# Patient Record
Sex: Male | Born: 1960 | Race: White | Hispanic: No | Marital: Married | Smoking: Never smoker
Health system: Southern US, Community
[De-identification: ages and names within clinical notes are randomized; demographics above are authoritative.]

---

## 2017-06-28 ENCOUNTER — Other Ambulatory Visit: Payer: Self-pay | Admitting: Internal Medicine

## 2017-06-28 ENCOUNTER — Ambulatory Visit
Admission: RE | Admit: 2017-06-28 | Discharge: 2017-06-28 | Disposition: A | Discharge status: Home / Self Care | Payer: PRIVATE HEALTH INSURANCE | Source: Ambulatory Visit | Attending: Internal Medicine | Admitting: Internal Medicine

## 2017-06-28 DIAGNOSIS — M546 Pain in thoracic spine: Secondary | ICD-10-CM | POA: Insufficient documentation

## 2018-12-31 IMAGING — CR DG THORACIC SPINE 2V
3 series · 3 of 3 positions shown · non-contrast
Comparison: None.

CLINICAL DATA: Pain at T7, T8, and T9 since trauma 9 days ago.

EXAM:
THORACIC SPINE 2 VIEWS

[t-spine ap]
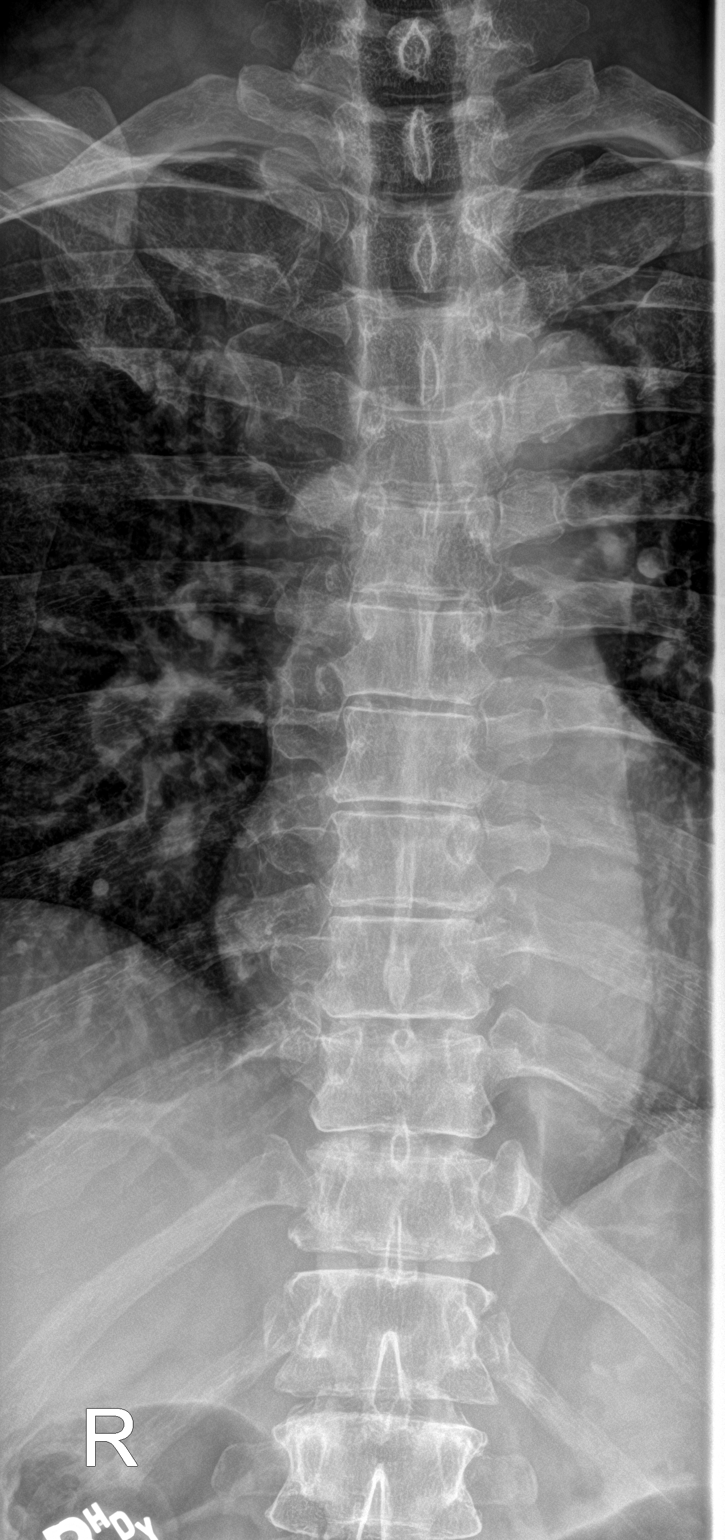

[t-spine lat]
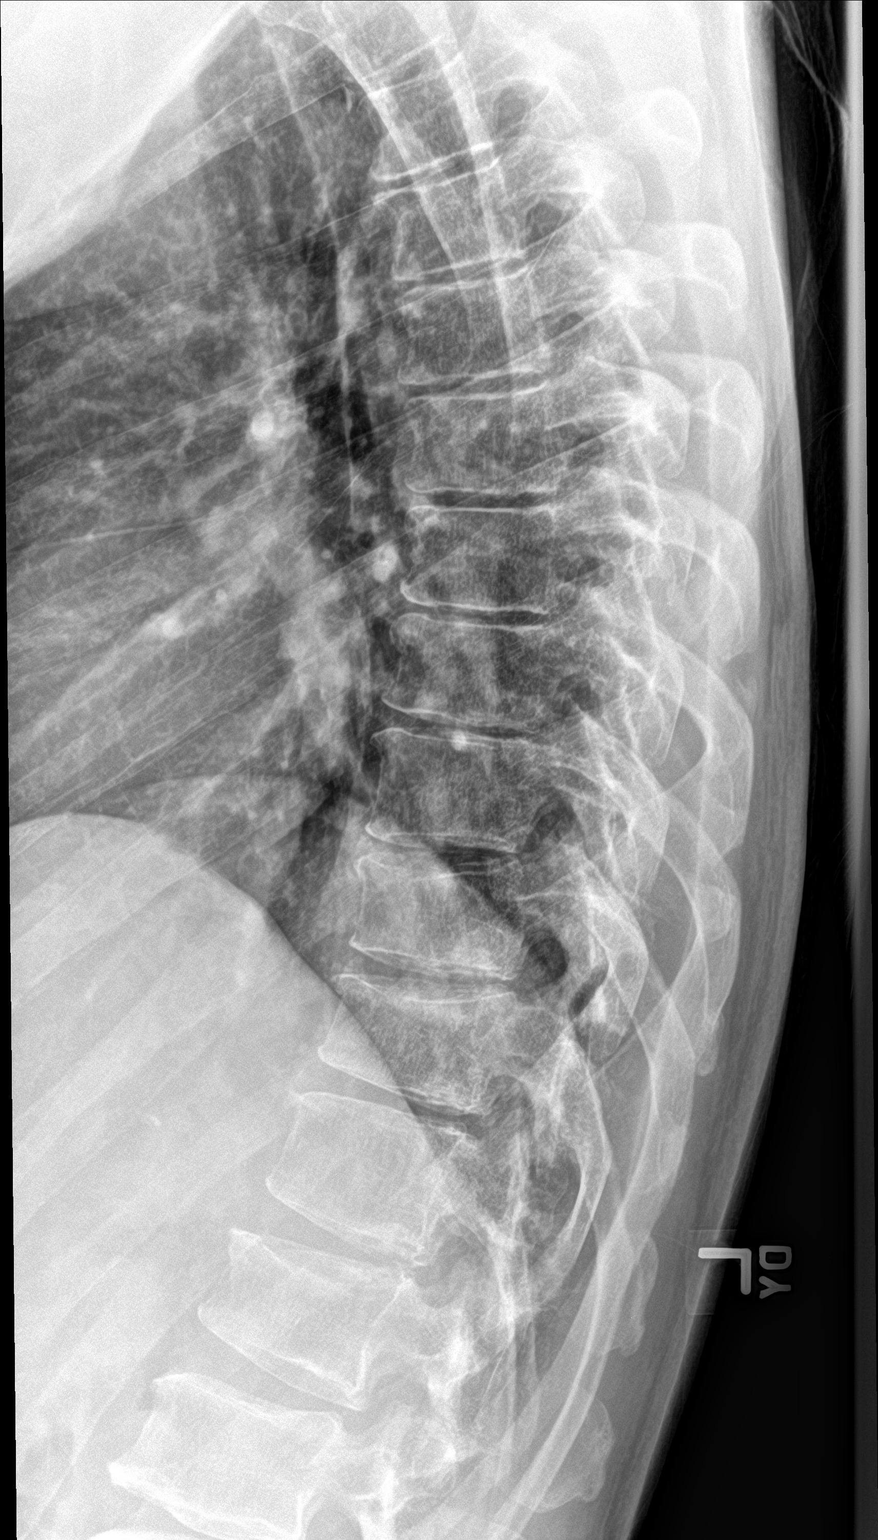

[t-spine swimmers]
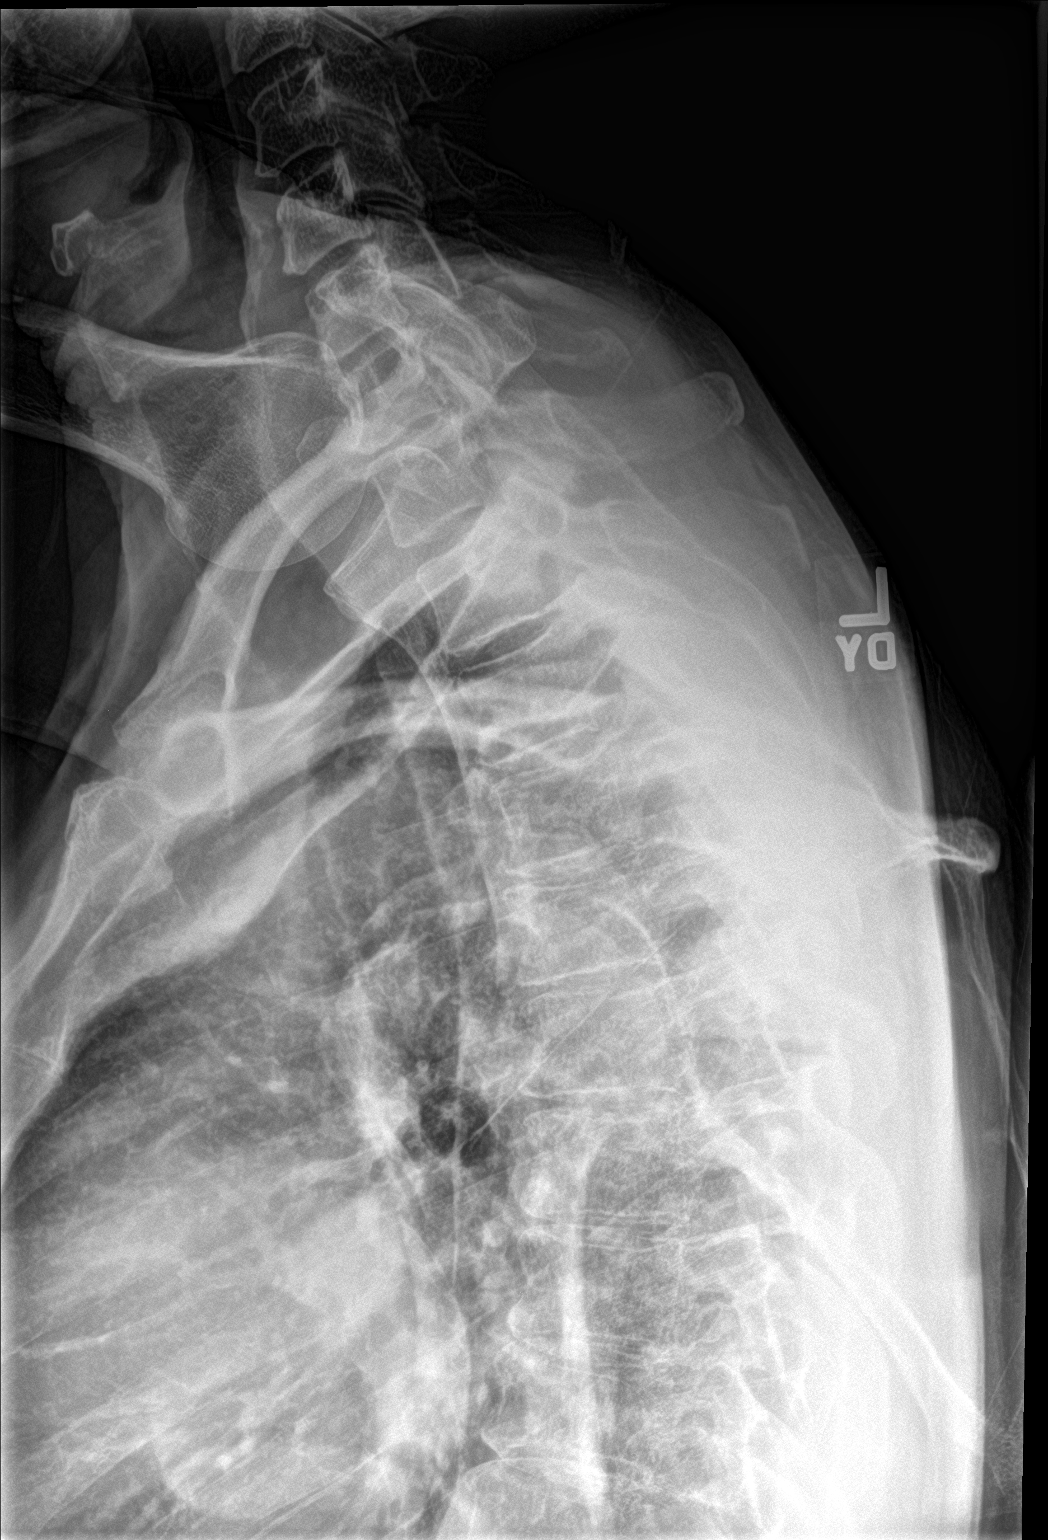

[3 of 3 positions shown; findings below may reference images not displayed]

FINDINGS: Limited views of the lungs are normal. No inter pedicular widening
identified. Anterior wedging is identified in 2 lower thoracic
vertebral bodies and an upper lumbar vertebral body, age
indeterminate. No other acute abnormalities identified.
IMPRESSION: 1. Anterior wedging of 3 vertebral bodies. I suspect 2 lower
thoracic vertebral bodies and an upper lumbar vertebral body are
involved. The findings are age indeterminate given history. A CT
scan or MRI could better assess chronicity as clinically warranted.

These results will be called to the ordering clinician or
representative by the Radiologist Assistant, and communication
documented in the PACS or zVision Dashboard.

## 2021-01-22 ENCOUNTER — Encounter: Payer: Self-pay | Admitting: Cardiovascular Disease

## 2021-01-22 ENCOUNTER — Telehealth (INDEPENDENT_AMBULATORY_CARE_PROVIDER_SITE_OTHER): Payer: PRIVATE HEALTH INSURANCE | Admitting: Cardiovascular Disease

## 2021-01-22 VITALS — BP 124/75 | HR 88 | Ht 73.75 in | Wt 181.0 lb

## 2021-01-22 DIAGNOSIS — Z13220 Encounter for screening for lipoid disorders: Secondary | ICD-10-CM

## 2021-01-22 DIAGNOSIS — Z136 Encounter for screening for cardiovascular disorders: Secondary | ICD-10-CM

## 2021-01-22 DIAGNOSIS — E785 Hyperlipidemia, unspecified: Secondary | ICD-10-CM

## 2021-01-22 NOTE — Progress Notes (Unsigned)
Virtual Visit via Video Note   This visit type was conducted due to national recommendations for restrictions regarding the COVID-19 Pandemic (e.g. social distancing) in an effort to limit this patient's exposure and mitigate transmission in our community.  Due to his co-morbid illnesses, this patient is at least at moderate risk for complications without adequate follow up.  This format is felt to be most appropriate for this patient at this time.  All issues noted in this document were discussed and addressed.  A limited physical exam was performed with this format.  Please refer to the patient's chart for his consent to telehealth for [facility].       Date:  01/22/2021   ID:  Robert Gutierrez, DOB 1961-05-21, MRN [id number] The patient was identified using 2 identifiers.  Patient Location: Other:  [place] Provider Location: Office/Clinic   PCP:  Marguarite Arbour, MD   [facility]   Cardiologist:  No primary care provider on file.  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  However, he is concerned about his cardiovascular risk and the possibility that he might be denied based on elevated cholesterol.  He is a very healthy person with no significant chronic medical conditions other than hyperlipidemia.  He has no history of essential hypertension, diabetes or tobacco use.  There is no family history of premature coronary artery disease.  His grandmother had CABG but she was in her 21s.  He has no symptoms of chest pain, shortness of breath, palpitations or dizziness.  He exercises regularly and is able to do sit-ups and push-ups without limitations.  He is able to run 2 miles and 17-1/2  minutes.  He usually runs for exercise 2 or 3 times per week.    The patient does not have symptoms concerning for COVID-19 infection (fever, chills, cough, or new shortness of breath).   Past medical history: Hyperlipidemia.  No outpatient medications have been marked as taking for the 01/22/21 encounter (Video Visit) with Iran Ouch, MD.     Allergies:   Patient has no allergy information on record.   Social History   Tobacco Use  . Smoking status: Never Smoker  . Smokeless tobacco: Never Used     Family Hx: The patient's family history is not on file.  ROS:   Please see the history of present illness.     All other systems reviewed and are negative.   Prior CV studies:   The following studies were reviewed today:    Labs/Other Tests and Data Reviewed:    EKG:  No ECG reviewed.  Recent Labs: No results found for requested labs within last 8760 hours.   Recent Lipid Panel No results found for: CHOL, TRIG, HDL, CHOLHDL, LDLCALC, LDLDIRECT  Wt Readings from Last 3 Encounters:  01/22/21 181 lb (82.1 kg)     Risk Assessment/Calculations:      Objective:    Vital Signs:  BP 124/75 Comment: all vitals taken yesterday 01/21/21  Pulse 88   Ht 6' 1.75" (1.873 m)   Wt 181 lb (82.1 kg)   BMI 23.40 kg/m    VITAL SIGNS:  reviewed GEN:  no acute  distress EYES:  sclerae anicteric, EOMI - Extraocular Movements Intact RESPIRATORY:  normal respiratory effort, symmetric expansion CARDIOVASCULAR:  no peripheral edema SKIN:  no rash, lesions or ulcers. NEURO:  alert and oriented x 3, no obvious focal deficit PSYCH:  normal affect  ASSESSMENT & PLAN:    1. Hyperlipidemia: He had recent labs done which showed total cholesterol of 239, HDL of 52, triglyceride of 90 and an LDL of 171.  His 10-year cardiovascular risk is 9.3% which might justify treatment with a statin.  Coronary calcium score will be helpful in risk stratification in this situation.  2.   Cardiovascular risk assessment: His 10-year ASCVD risk is 9.3% and his Framingham CHD risk is 10% which is medium risk. Considering the fact that he has no cardiac symptoms and he is physically fit with no exertional symptoms, he should be considered at low risk for cardiovascular events.  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  He was found to have an increased cardiovascular risk by calculation while at [facility] [employer] [facility] [employer] in preparation for deployment. He was seen virtually today and his blood pressure is 124/75.  His total cholesterol was 239 with an HDL of 52, triglyceride of 90 and an LDL of 171.  He is not on any antihypertensive medication or lipid-lowering agents.  He is not on any prescription medications.  He has excellent exercise tolerance as he is able to finish a 2 mile run in 17-1/2 minutes which is equal to 20 METS of workload. His calculated 10-year cardiovascular risk is 9.3% and his Framingham CHD risk is 10% which is considered in the medium range.  However, his medical history and exercise tolerance suggest that his 10-year cardiovascular risk factors overestimate his risk of cardiovascular events which should be considered low.      Time:   Today, I have spent 12 minutes with the patient with telehealth technology discussing the above problems.     Medication Adjustments/Labs and Tests Ordered: Current medicines are reviewed at length with the patient today.  Concerns regarding medicines are outlined above.   Tests Ordered: No orders of the defined types were placed in this encounter.   Medication Changes: No orders of the defined types were placed in this encounter.   Follow Up:   As needed  Signed, Lorine Bears, MD  01/22/2021 1:34 PM    [facility]

## 2022-10-01 ENCOUNTER — Telehealth: Payer: Self-pay | Admitting: Cardiovascular Disease

## 2022-10-01 NOTE — Telephone Encounter (Signed)
This patient has not been seen in over a year.  He will need to schedule an office visit to determine what appropriate testing is needed, if any.  Please call to schedule an office visit with Dr. Fletcher Anon or an APP.  His last visit was also virtual, so he needs to come in for the visit.

## 2022-10-01 NOTE — Telephone Encounter (Signed)
Patient would like to have stress or echo test done. Please advise

## 2022-10-06 NOTE — Telephone Encounter (Signed)
Patient came by office Friday 10/27 Patient informed he will need an appointment, states he will call to schedule when ready

## 2022-10-06 NOTE — Telephone Encounter (Signed)
Pt needs to make and appointment and ask if you get his voicemail please leave a message.

## 2022-10-16 ENCOUNTER — Telehealth: Payer: Self-pay | Admitting: Cardiovascular Disease

## 2022-10-16 DIAGNOSIS — Z0000 Encounter for general adult medical examination without abnormal findings: Secondary | ICD-10-CM

## 2022-10-16 DIAGNOSIS — Z9185 Personal history of military service: Secondary | ICD-10-CM

## 2022-10-16 NOTE — Telephone Encounter (Signed)
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

## 2022-10-16 NOTE — Telephone Encounter (Signed)
It is ok to order stress test? His last appt w/ you was 01/22/21, f/u as needed.

## 2022-10-17 NOTE — Telephone Encounter (Signed)
Schedule a treadmill stress test and follow-up with me after.

## 2022-10-27 ENCOUNTER — Telehealth (HOSPITAL_COMMUNITY): Payer: Self-pay | Admitting: Radiology

## 2022-10-27 ENCOUNTER — Telehealth: Payer: Self-pay | Admitting: Cardiovascular Disease

## 2022-10-27 DIAGNOSIS — Z9185 Personal history of military service: Secondary | ICD-10-CM

## 2022-10-27 DIAGNOSIS — Z0000 Encounter for general adult medical examination without abnormal findings: Secondary | ICD-10-CM

## 2022-10-27 NOTE — Telephone Encounter (Signed)
Yes that is fine

## 2022-10-27 NOTE — Telephone Encounter (Signed)
Pt called requesting if ETT could be changed to exercise myoview.  Will forward to MD for approval

## 2022-10-27 NOTE — Telephone Encounter (Signed)
Patient given detailed instructions per Myocardial Perfusion Study Information Sheet for the test on 10/28/2022 at 12:45. Patient notified to arrive 15 minutes early and that it is imperative to arrive on time for appointment to keep from having the test rescheduled.  If you need to cancel or reschedule your appointment, please call the office within 24 hours of your appointment. . Patient verbalized understanding.EHK

## 2022-10-27 NOTE — Telephone Encounter (Signed)
Pt made aware test changed to exercise myoview.

## 2022-10-27 NOTE — Telephone Encounter (Signed)
Patient is calling requesting the order for his test be switched to a myocardial perfusion. He would like a callback to reschedule test once order is place due to being scheduled for tomorrow. Please advise.

## 2022-10-28 ENCOUNTER — Ambulatory Visit (HOSPITAL_COMMUNITY): Payer: PRIVATE HEALTH INSURANCE | Attending: Cardiology

## 2022-10-28 DIAGNOSIS — Z0000 Encounter for general adult medical examination without abnormal findings: Secondary | ICD-10-CM | POA: Insufficient documentation

## 2022-10-28 DIAGNOSIS — I4519 Other right bundle-branch block: Secondary | ICD-10-CM | POA: Insufficient documentation

## 2022-10-28 DIAGNOSIS — Z9185 Personal history of military service: Secondary | ICD-10-CM | POA: Insufficient documentation

## 2022-10-28 LAB — MYOCARDIAL PERFUSION IMAGING
Estimated workload: 13.4
MPHR: 159 {beats}/min
Nuc Stress EF: 30 %
Peak HR: 176 {beats}/min
Rest HR: 104 {beats}/min
Rest Nuclear Isotope Dose: 10.9 mCi
SDS: 0
SRS: 0
SSS: 0
TID: 0.9

## 2022-10-28 MED ORDER — TECHNETIUM TC 99M TETROFOSMIN IV KIT
32.4000 | PACK | Freq: Once | INTRAVENOUS | Status: AC | PRN
  Administered 2022-10-28: 32.4 via INTRAVENOUS

## 2022-10-28 MED ORDER — TECHNETIUM TC 99M TETROFOSMIN IV KIT
10.9000 | PACK | Freq: Once | INTRAVENOUS | Status: AC | PRN
  Administered 2022-10-28: 10.9 via INTRAVENOUS

## 2022-10-29 ENCOUNTER — Telehealth: Payer: Self-pay | Admitting: Cardiovascular Disease

## 2022-10-29 DIAGNOSIS — R931 Abnormal findings on diagnostic imaging of heart and coronary circulation: Secondary | ICD-10-CM

## 2022-10-29 LAB — MYOCARDIAL PERFUSION IMAGING
Angina Index: 0
Base ST Depression (mm): 0 mm
Duke Treadmill Score: 12
Exercise duration (min): 11 min
Exercise duration (sec): 39 s
LV dias vol: 128 mL (ref 62–150)
LV sys vol: 89 mL
Percent HR: 111 %
ST Depression (mm): 0 mm
Stress Nuclear Isotope Dose: 32.4 mCi

## 2022-10-29 NOTE — Telephone Encounter (Signed)
Pt calling because he trying to read his lexiscan results but cannot connect to mychart, pt would like his results emailed to him if possible.     Email : [email or web address]

## 2022-11-03 NOTE — Telephone Encounter (Signed)
Patient made aware of results and verbalized understanding. Echo appointment made for 12/1.  Per Dr. Kirke Corin, Inform patient that his stress test showed no evidence of ischemia.  However, his ejection fraction was low around 35%.  I recommend an urgent echocardiogram within 1 week.  If no availability in our office, we can order it to be done at the hospital.  In addition, his blood pressure was high at rest and during stress.  Schedule him for an office follow-up visit with me after his echocardiogram.

## 2022-11-07 ENCOUNTER — Ambulatory Visit: Payer: PRIVATE HEALTH INSURANCE | Attending: Cardiovascular Disease

## 2022-11-07 DIAGNOSIS — R931 Abnormal findings on diagnostic imaging of heart and coronary circulation: Secondary | ICD-10-CM

## 2022-11-07 LAB — ECHOCARDIOGRAM COMPLETE
AR max vel: 3.32 cm2
AV Area VTI: 3.45 cm2
AV Area mean vel: 3.49 cm2
AV Mean grad: 2 mmHg
AV Peak grad: 4.2 mmHg
Ao pk vel: 1.03 m/s
Area-P 1/2: 3.4 cm2
Calc EF: 36.8 %
S' Lateral: 4.5 cm
Single Plane A2C EF: 36.9 %
Single Plane A4C EF: 39.4 %

## 2022-11-18 ENCOUNTER — Other Ambulatory Visit: Payer: Self-pay | Admitting: *Deleted

## 2022-11-18 DIAGNOSIS — I493 Ventricular premature depolarization: Secondary | ICD-10-CM

## 2022-11-25 ENCOUNTER — Encounter: Payer: Self-pay | Admitting: Cardiovascular Disease

## 2022-11-25 ENCOUNTER — Other Ambulatory Visit
Admission: RE | Admit: 2022-11-25 | Discharge: 2022-11-25 | Disposition: A | Discharge status: Home / Self Care | Payer: PRIVATE HEALTH INSURANCE | Source: Ambulatory Visit | Attending: Cardiovascular Disease | Admitting: Cardiovascular Disease

## 2022-11-25 ENCOUNTER — Ambulatory Visit: Payer: PRIVATE HEALTH INSURANCE | Attending: Cardiovascular Disease

## 2022-11-25 ENCOUNTER — Ambulatory Visit: Payer: PRIVATE HEALTH INSURANCE | Attending: Cardiovascular Disease | Admitting: Cardiovascular Disease

## 2022-11-25 VITALS — BP 106/78 | HR 74 | Ht 73.5 in | Wt 193.0 lb

## 2022-11-25 DIAGNOSIS — R0602 Shortness of breath: Secondary | ICD-10-CM | POA: Diagnosis present

## 2022-11-25 DIAGNOSIS — I493 Ventricular premature depolarization: Secondary | ICD-10-CM | POA: Diagnosis present

## 2022-11-25 DIAGNOSIS — I429 Cardiomyopathy, unspecified: Secondary | ICD-10-CM

## 2022-11-25 DIAGNOSIS — E785 Hyperlipidemia, unspecified: Secondary | ICD-10-CM

## 2022-11-25 LAB — BASIC METABOLIC PANEL
Anion gap: 9 (ref 5–15)
BUN: 25 mg/dL — ABNORMAL HIGH (ref 8–23)
CO2: 29 mmol/L (ref 22–32)
Calcium: 9.6 mg/dL (ref 8.9–10.3)
Chloride: 99 mmol/L (ref 98–111)
Creatinine, Ser: 1.29 mg/dL — ABNORMAL HIGH (ref 0.61–1.24)
GFR, Estimated: >60 mL/min (ref >60)
Glucose, Bld: 117 mg/dL — ABNORMAL HIGH (ref 70–99)
Potassium: 4.4 mmol/L (ref 3.5–5.1)
Sodium: 137 mmol/L (ref 135–145)

## 2022-11-25 MED ORDER — METOPROLOL TARTRATE 50 MG PO TABS: ORAL_TABLET | ORAL | 0 refills | Status: DC

## 2022-11-25 NOTE — Patient Instructions (Addendum)
Medication Instructions:  No changes *If you need a refill on your cardiac medications before your next appointment, please call your pharmacy*   Lab Work: Your provider would like for you to have following labs drawn: BMET.   Please go to the [facility] entrance and check in at the front desk.  You do not need an appointment.  They are open from 7am-6 pm.   If you have labs (blood work) drawn today and your tests are completely normal, you will receive your results only by: MyChart Message (if you have MyChart) OR A paper copy in the mail If you have any lab test that is abnormal or we need to change your treatment, we will call you to review the results.   Follow-Up: At [facility], you and your health needs are our priority.  As part of our continuing mission to provide you with exceptional heart care, we have created designated Provider Care Teams.  These Care Teams include your primary Cardiologist (physician) and Advanced Practice Providers (APPs -  Physician Assistants and Nurse Practitioners) who all work together to provide you with the care you need, when you need it.  We recommend signing up for the patient portal called "MyChart".  Sign up information is provided on this After Visit Summary.  MyChart is used to connect with patients for Virtual Visits (Telemedicine).  Patients are able to view lab/test results, encounter notes, upcoming appointments, etc.  Non-urgent messages can be sent to your provider as well.   To learn more about what you can do with MyChart, go to [email or web address].    Your next appointment:   Follow up pending results  Other Instructions   Your cardiac CT will be scheduled at one of the below locations:   [facility] [address] [place], [place] [address] [phone]  OR  [facility] [address] [place], [place] [address] [phone]  OR    [facility] [address] [place], [place] [address] [phone]  If scheduled at [facility], please arrive at the [facility] and Children's Entrance (Entrance C2) of [facility] 30 minutes prior to test start time. You can use the FREE valet parking offered at entrance C (encouraged to control the heart rate for the test)  Proceed to the [facility] Radiology Department (first floor) to check-in and test prep.  All radiology patients and guests should use entrance C2 at [facility], accessed from [facility], even though the hospital's physical address listed is [address].    If scheduled at [facility] or [facility], please arrive 15 mins early for check-in and test prep.   Please follow these instructions carefully (unless otherwise directed):  Hold all erectile dysfunction medications at least 3 days (72 hrs) prior to test. (Ie viagra, cialis, sildenafil, tadalafil, etc) We will administer nitroglycerin during this exam.   On the Night Before the Test: Be sure to Drink plenty of water. Do not consume any caffeinated/decaffeinated beverages or chocolate 12 hours prior to your test. Do not take any antihistamines 12 hours prior to your test.  On the Day of the Test: Drink plenty of water until 1 hour prior to the test. Do not eat any food 1 hour prior to test. You may take your regular medications prior to the test.  Take metoprolol (Lopressor) two hours prior to test.  After  the Test: Drink plenty of water. After receiving IV contrast, you may experience a mild flushed feeling. This is normal. On occasion, you may experience a mild rash up to 24 hours after the test. This is not dangerous. If this occurs, you can take Benadryl 25 mg and increase your fluid intake. If you experience trouble breathing, this can be serious. If it is severe call 911  IMMEDIATELY. If it is mild, please call our office. If you take any of these medications: Glipizide/Metformin, Avandament, Glucavance, please do not take 48 hours after completing test unless otherwise instructed.  We will call to schedule your test 2-4 weeks out understanding that some insurance companies will need an authorization prior to the service being performed.   For non-scheduling related questions, please contact the cardiac imaging nurse navigator should you have any questions/concerns: Rockwell Alexandria, Cardiac Imaging Nurse Navigator Larey Brick, Cardiac Imaging Nurse Navigator [facility] Direct Office Dial: [phone]   For scheduling needs, including cancellations and rescheduling, please call [place], [phone].

## 2022-11-25 NOTE — Addendum Note (Signed)
Addended by: Sandi Mariscal on: 11/25/2022 02:39 PM   Modules accepted: Orders

## 2022-11-25 NOTE — Addendum Note (Signed)
Addended by: Sandi Mariscal on: 11/25/2022 02:38 PM   Modules accepted: Orders

## 2022-11-25 NOTE — Progress Notes (Signed)
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 He has been healthy throughout his life with the exception of untreated hyperlipidemia.  He is a lifelong non-smoker. There is no family history of premature coronary artery disease.  His grandmother had CABG but she was in her 36s.  His father died of multiple sclerosis.  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  He was able to exercise for 12 minutes and limited by shortness of breath but no chest pain.  In addition, he had hypertensive response to exercise.  Nuclear stress test showed no significant perfusion defects.  However, he was noted to have an ejection fraction of 35%. An echocardiogram was performed which showed an EF of 40 to 45% with no significant valvular abnormalities.  He reports having recent frequent PVCs that improved after he cut down on caffeine intake.  He denies any chest pain and describes mild exertional dyspnea.  No syncope or presyncope.   History reviewed. No pertinent past medical history.  History reviewed. No pertinent surgical history.   Current Outpatient Medications  Medication Sig Dispense Refill   metoprolol succinate (TOPROL-XL) 50 MG 24 hr tablet Take 50 mg by mouth daily as needed.     metoprolol tartrate (LOPRESSOR) 50 MG tablet Tale one tablet two hours prior to the test. 1 tablet 0   No current facility-administered medications for this visit.    Allergies:   Sulfa antibiotics    Social History:  The patient  reports that he has never smoked. He has been exposed to  tobacco smoke. He has never used smokeless tobacco. He reports that he does not currently use alcohol. He reports that he does not use drugs.   Family History:  The patient's family history is not on file.    ROS:  Please see the history of present illness.   Otherwise, review of systems are positive for none.   All other systems are reviewed and negative.    PHYSICAL EXAM: VS:  BP 106/78 (BP Location: Left Arm, Patient Position: Sitting, Cuff Size: Normal)   Pulse 74   Ht 6' 1.5" (1.867 m)   Wt 193 lb (87.5 kg)   SpO2 98%   BMI 25.12 kg/m  , BMI Body mass index is 25.12 kg/m. GEN: Well nourished, well developed, in no acute distress  HEENT: normal  Neck: no JVD, carotid bruits, or masses Cardiac: RRR; no murmurs, rubs, or gallops,no edema  Respiratory:  clear to auscultation bilaterally, normal work of breathing GI: soft, nontender, nondistended, + BS MS: no deformity or atrophy  Skin: warm and dry, no rash Neuro:  Strength and sensation are intact Psych: euthymic mood, full affect   EKG:  EKG is ordered today. 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   Recent Labs: 11/25/2022: BUN 25; Creatinine, Ser 1.29; Potassium 4.4; Sodium 137    Lipid Panel No results found for: "CHOL", "TRIG", "HDL", "CHOLHDL", "VLDL", "LDLCALC", "LDLDIRECT"  Wt Readings from Last 3 Encounters:  11/25/22 193 lb (87.5 kg)  10/28/22 181 lb (82.1 kg)  01/22/21 181 lb (82.1 kg)           No data to display            ASSESSMENT AND PLAN:  1.  Cardiomyopathy of unclear etiology: Ejection fraction was 35% by nuclear stress test and 40% by echocardiogram.  Given his age and known history of severe hyperlipidemia, I do think we have to exclude underlying ischemic cardiomyopathy.  Even though he had a nuclear stress test done recently, that does not exclude the possibility of three-vessel disease and  balanced ischemia.  Options include left heart catheterization versus cardiac CTA.  Both of these were discussed with him today.  Given that the overall suspicion is relatively low, I think it is reasonable to proceed with cardiac CTA and this was requested today.  He is not allergic to contrast. He is currently [place] heart association class I.  No evidence of volume overload.  No need for beta-blocker or ACE inhibitor at this time especially in the setting of relatively low blood pressure.  2.  Hyperlipidemia: He reports recent cholesterol was 257.  Will wait until we get the results of cardiac CTA to determine how aggressive we want to be with medications.  3.  Symptomatic PVCs: We have to exclude the possibility of PVC mediated cardiomyopathy.  We requested a 1 weeks ZIO monitor.   Disposition:   FU with me in 3 months  Signed,  Lorine Bears, MD  11/25/2022 5:29 PM    [facility]

## 2022-11-26 ENCOUNTER — Telehealth (HOSPITAL_COMMUNITY): Payer: Self-pay | Admitting: *Deleted

## 2022-11-26 NOTE — Telephone Encounter (Signed)
Attempted to call patient regarding upcoming cardiac CT appointment. °Left message on voicemail with name and callback number ° °Madysun Thall RN Navigator Cardiac Imaging °[facility] °[phone] Office °[phone] Cell ° °

## 2022-11-27 ENCOUNTER — Ambulatory Visit
Admission: RE | Admit: 2022-11-27 | Discharge: 2022-11-27 | Disposition: A | Discharge status: Home / Self Care | Payer: PRIVATE HEALTH INSURANCE | Source: Ambulatory Visit | Attending: Cardiovascular Disease | Admitting: Cardiovascular Disease

## 2022-11-27 ENCOUNTER — Other Ambulatory Visit: Payer: Self-pay | Admitting: Cardiovascular Disease

## 2022-11-27 ENCOUNTER — Encounter: Payer: Self-pay | Admitting: Cardiovascular Disease

## 2022-11-27 DIAGNOSIS — R931 Abnormal findings on diagnostic imaging of heart and coronary circulation: Secondary | ICD-10-CM

## 2022-11-27 DIAGNOSIS — R0602 Shortness of breath: Secondary | ICD-10-CM | POA: Insufficient documentation

## 2022-11-27 DIAGNOSIS — I493 Ventricular premature depolarization: Secondary | ICD-10-CM | POA: Diagnosis present

## 2022-11-27 MED ORDER — NITROGLYCERIN 0.4 MG SL SUBL
0.8000 mg | SUBLINGUAL_TABLET | Freq: Once | SUBLINGUAL | Status: AC
  Administered 2022-11-27: 0.8 mg via SUBLINGUAL

## 2022-11-27 MED ORDER — IOHEXOL 350 MG/ML SOLN
75.0000 mL | Freq: Once | INTRAVENOUS | Status: AC | PRN
  Administered 2022-11-27: 75 mL via INTRAVENOUS

## 2022-11-27 MED ORDER — METOPROLOL TARTRATE 5 MG/5ML IV SOLN
10.0000 mg | Freq: Once | INTRAVENOUS | Status: AC
  Administered 2022-11-27: 10 mg via INTRAVENOUS

## 2022-11-27 NOTE — Progress Notes (Signed)
Patient tolerated CT well. Drank water after. Vital signs stable encourage to drink water throughout day.Reasons explained and verbalized understanding. Ambulated steady gait.  

## 2022-11-28 DIAGNOSIS — I493 Ventricular premature depolarization: Secondary | ICD-10-CM

## 2022-12-03 ENCOUNTER — Other Ambulatory Visit: Payer: Self-pay | Admitting: *Deleted

## 2022-12-03 DIAGNOSIS — E785 Hyperlipidemia, unspecified: Secondary | ICD-10-CM

## 2022-12-03 MED ORDER — ASPIRIN 81 MG PO TBEC: 81.0000 mg | DELAYED_RELEASE_TABLET | Freq: Every day | ORAL | 3 refills | Status: AC

## 2022-12-19 ENCOUNTER — Encounter: Payer: Self-pay | Admitting: *Deleted

## 2023-05-12 ENCOUNTER — Ambulatory Visit: Payer: PRIVATE HEALTH INSURANCE | Admitting: Cardiovascular Disease

## 2023-07-23 ENCOUNTER — Ambulatory Visit: Payer: PRIVATE HEALTH INSURANCE | Admitting: Cardiovascular Disease
